# Patient Record
Sex: Male | Born: 1970 | Marital: Married | Smoking: Never smoker
Health system: Southern US, Community
[De-identification: ages and names within clinical notes are randomized; demographics above are authoritative.]

## PROBLEM LIST (undated history)

## (undated) DIAGNOSIS — E785 Hyperlipidemia, unspecified: Secondary | ICD-10-CM

## (undated) HISTORY — DX: Hyperlipidemia, unspecified: E78.5

## (undated) HISTORY — PX: APPENDECTOMY: SHX54

---

## 2005-12-30 ENCOUNTER — Inpatient Hospital Stay (HOSPITAL_COMMUNITY): Admission: EM | Admit: 2005-12-30 | Discharge: 2006-01-01 | Payer: Self-pay | Admitting: Emergency Medicine

## 2006-04-21 ENCOUNTER — Ambulatory Visit: Payer: Self-pay | Admitting: Nurse Practitioner

## 2006-12-29 DIAGNOSIS — M79609 Pain in unspecified limb: Secondary | ICD-10-CM | POA: Insufficient documentation

## 2007-08-14 IMAGING — CT CT PELVIS W/ CM
2 of 5 series · 17 of 46 positions shown, 19 images · IV contrast (ORAL OMNI 350 & 100 ML OMNI 300)
Comparison: None.

CLINICAL DATA: Abdominal pain.  
 ABDOMEN CT WITH CONTRAST:
TECHNIQUE: Multidetector CT imaging of the abdomen was performed following the standard protocol during bolus administration of intravenous contrast.
 Contrast:  100 cc Omnipaque 300
TECHNIQUE: Multidetector CT imaging of the pelvis was performed following the standard protocol during bolus administration of intravenous contrast.

[Series 2: routine abdomen · axial · 0.70mm/px · z∈[-541,-116]mm · 14 of 96 slices shown, 16 images]
[im 6/96  soft-tissue]
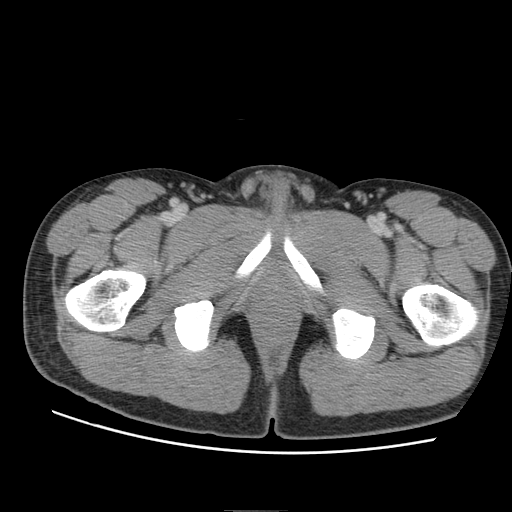
[im 6/96  bone]
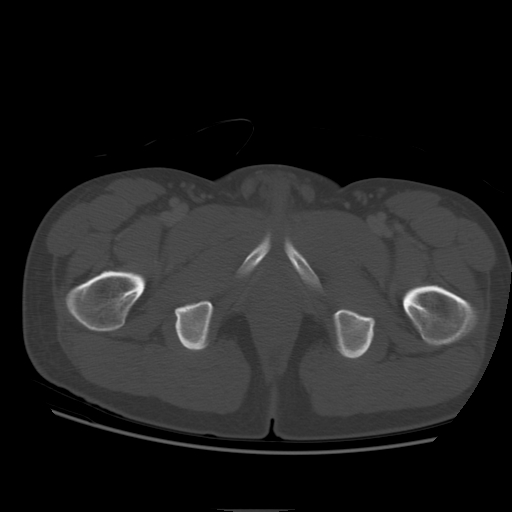
[im 11/96  soft-tissue]
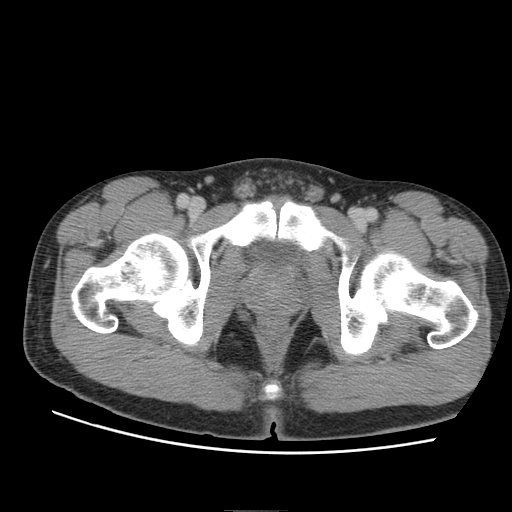
[im 21/96  soft-tissue]
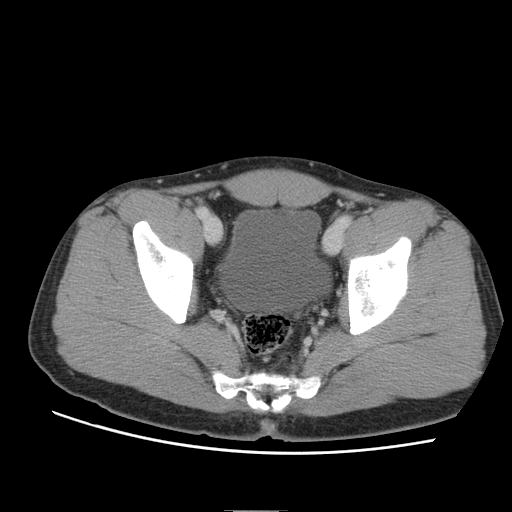
[im 26/96  soft-tissue]
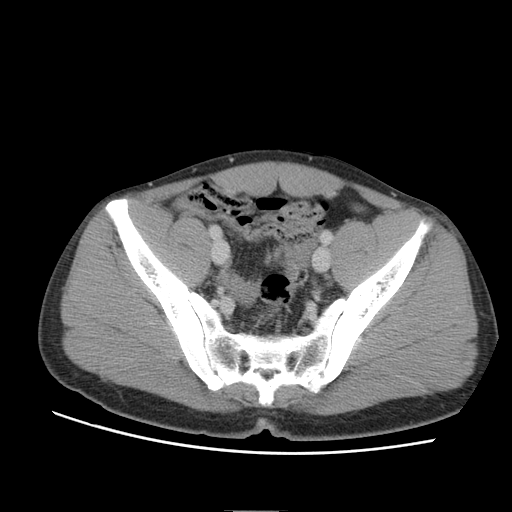
[im 31/96  soft-tissue]
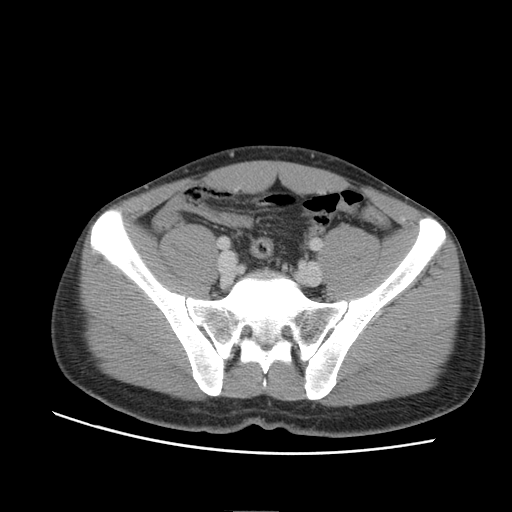
[im 41/96  soft-tissue]
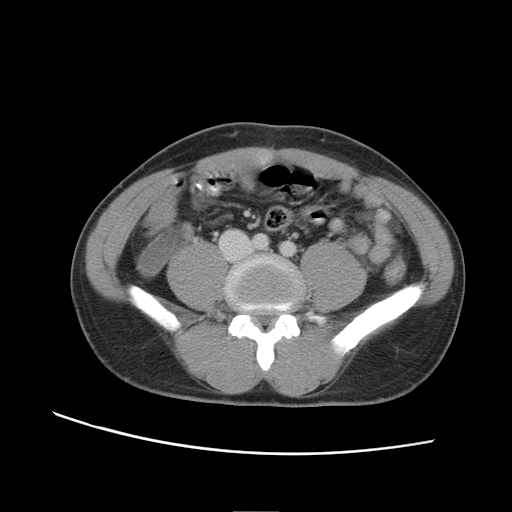
[im 46/96  soft-tissue]
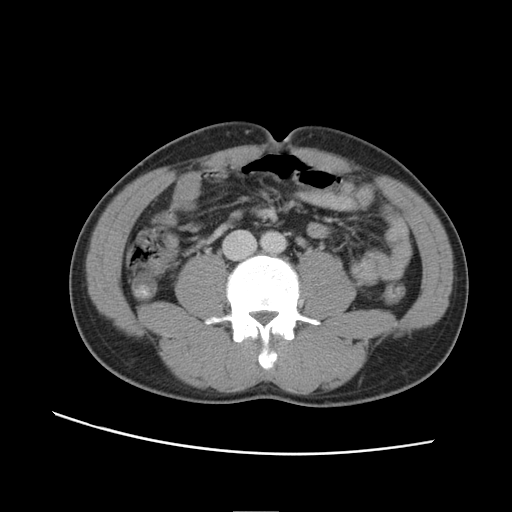
[im 51/96  soft-tissue]
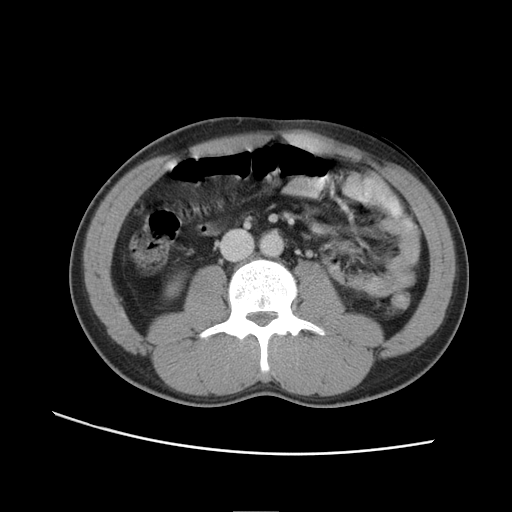
[im 56/96  soft-tissue]
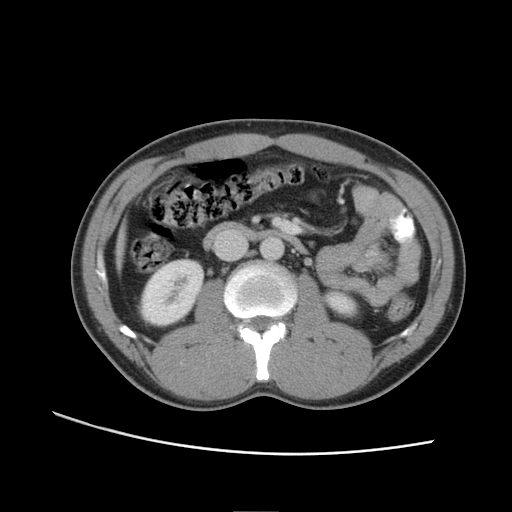
[im 56/96  bone]
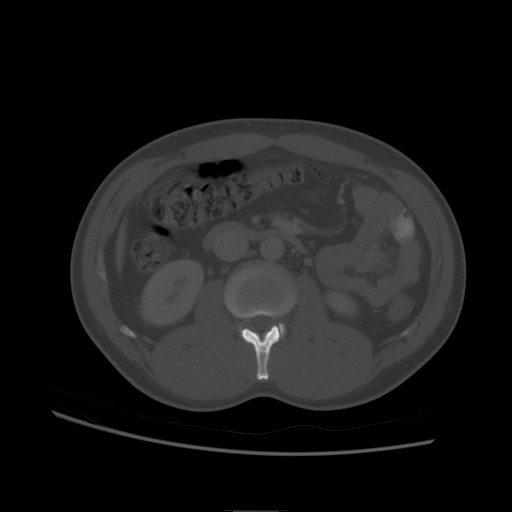
[im 66/96  soft-tissue]
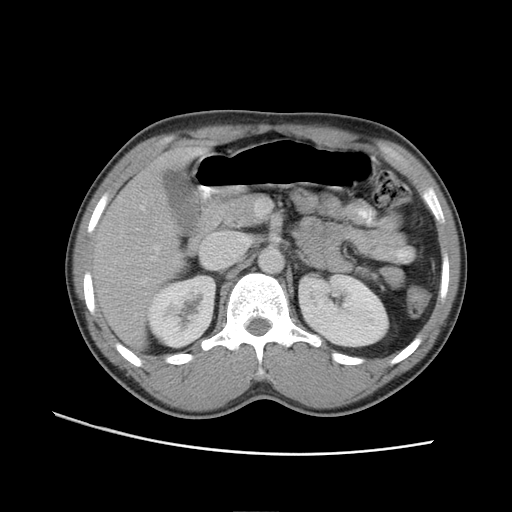
[im 71/96  soft-tissue]
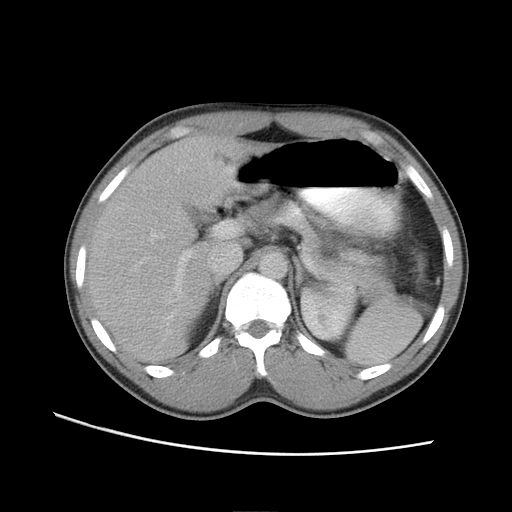
[im 76/96  soft-tissue]
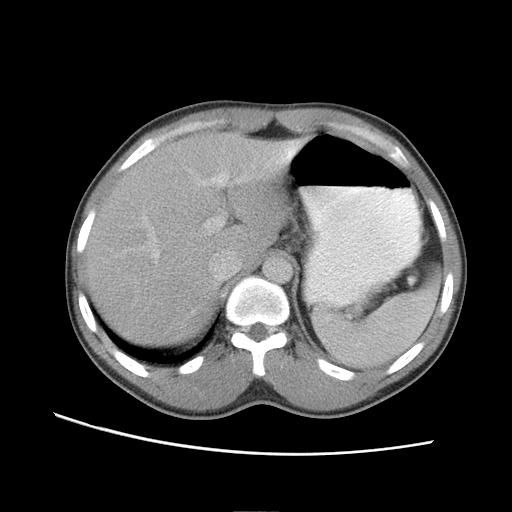
[im 86/96  soft-tissue]
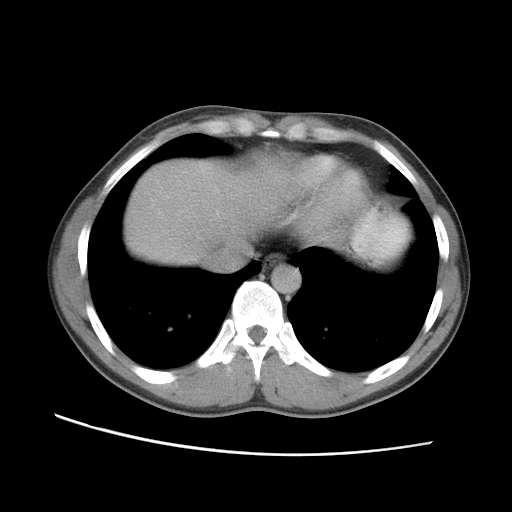
[im 91/96  soft-tissue]
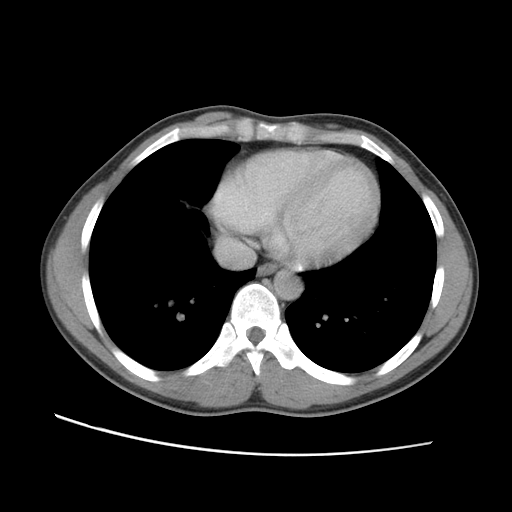

[Series 401: reformatted · coronal · 0.95mm/px · 3 of 106 slices shown]
[im 36/106  soft-tissue]
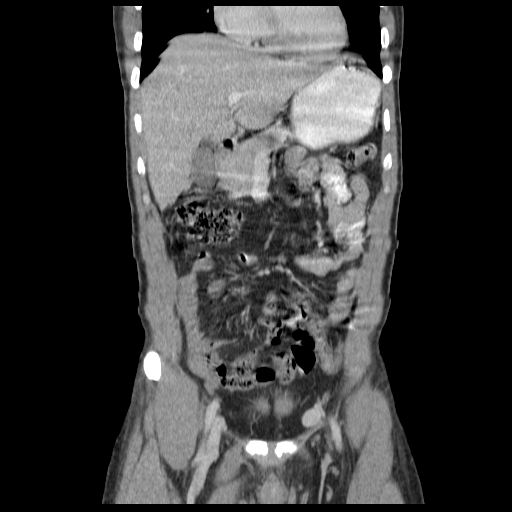
[im 47/106  soft-tissue]
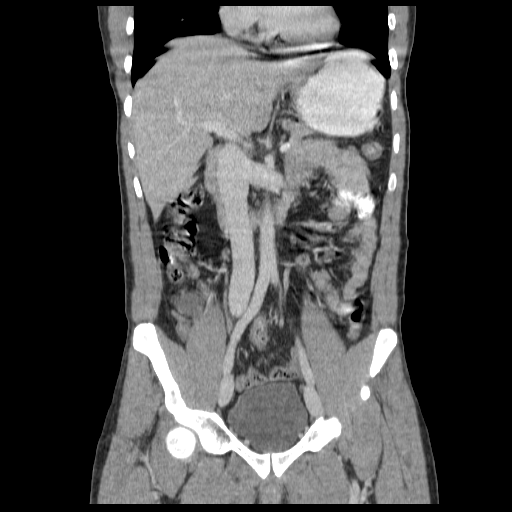
[im 59/106  soft-tissue]
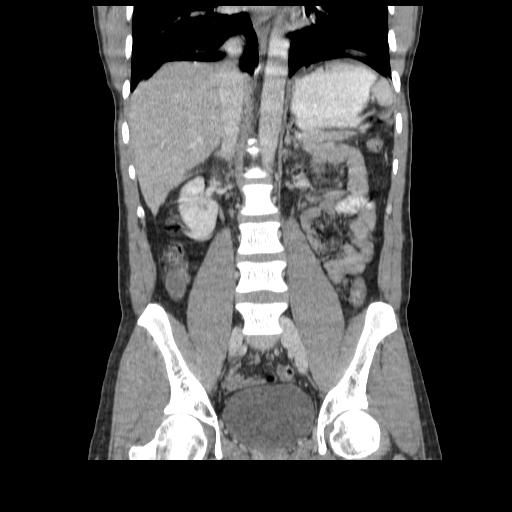

[17 of 46 positions shown; findings below may reference images not displayed]

FINDINGS: Lung bases are clear. 
 The liver is normal in attenuation and morphology.  The spleen is negative.  The pancreas is negative.  The adrenal glands are negative.  The right kidney is negative.  The left kidney is negative.  There is no adenopathy.  There is no free fluid or abscess formation.  The gallbladder is normal in appearance. 
 The appendix is abnormal in appearance measuring 16 mm in diameter.  Findings are consistent with acute appendicitis.  There is no evidence for appendiceal perforation or periappendiceal abscess formation.  
 The kidneys are negative. 
 Review of bone windows shows no lytic or sclerotic lesions.
IMPRESSION: Acute appendicitis.  
 PELVIS CT WITH CONTRAST:
FINDINGS: Urinary bladder is negative.  Negative for free fluid.  Negative for adenopathy.  
 Review of bone windows is negative.
IMPRESSION: No acute pelvis CT findings.

## 2016-06-07 ENCOUNTER — Other Ambulatory Visit: Payer: Self-pay | Admitting: General Surgery

## 2018-03-29 ENCOUNTER — Other Ambulatory Visit: Payer: Self-pay

## 2018-03-29 ENCOUNTER — Encounter (HOSPITAL_COMMUNITY): Payer: Self-pay | Admitting: Emergency Medicine

## 2018-03-29 ENCOUNTER — Emergency Department (HOSPITAL_COMMUNITY)
Admission: EM | Admit: 2018-03-29 | Discharge: 2018-03-29 | Disposition: A | Discharge status: Home / Self Care | Payer: Medicaid Other | Attending: Emergency Medicine | Admitting: Emergency Medicine

## 2018-03-29 DIAGNOSIS — R0981 Nasal congestion: Secondary | ICD-10-CM | POA: Diagnosis present

## 2018-03-29 DIAGNOSIS — J069 Acute upper respiratory infection, unspecified: Secondary | ICD-10-CM | POA: Diagnosis not present

## 2018-03-29 DIAGNOSIS — Z209 Contact with and (suspected) exposure to unspecified communicable disease: Secondary | ICD-10-CM | POA: Insufficient documentation

## 2018-03-29 MED ORDER — FLUTICASONE PROPIONATE 50 MCG/ACT NA SUSP: 1.0000 | Freq: Every day | NASAL | 2 refills | Status: DC

## 2018-03-29 NOTE — ED Triage Notes (Signed)
Pt c/o sinus pain in upper nose and runny nose for 2 days.

## 2018-03-29 NOTE — Discharge Instructions (Signed)
You can take Tylenol or Ibuprofen as directed for pain. You can alternate Tylenol and Ibuprofen every 4 hours. If you take Tylenol at 1pm, then you can take Ibuprofen at 5pm. Then you can take Tylenol again at 9pm.   Use Flonase as directed.  [facility] to establish a primary care doctor if you do not have one.   Return emergency department for any high fever, difficulty breathing, chest pain, cough or any other worsening or concerning symptoms.

## 2018-03-29 NOTE — ED Provider Notes (Signed)
[facility]-EMERGENCY DEPT Provider Note   CSN: [id number] Arrival date & time: 03/29/18  [id number]     History   Chief Complaint Chief Complaint  Patient presents with  . Facial Pain  . Nasal Congestion    HPI William Mcconnell is a 47 y.o. male who presents for evaluation of 2 days of rhinorrhea, congestion.  He also reports some pain and pressure to his nose.  Patient states he has not had any cough, sore throat.  He states he has had some subjective fever chills but has not actually measured a temperature.  He reports both his children are at home sick with similar symptoms.  Patient denies any difficulty breathing, vomiting.  The history is provided by the patient.    History reviewed. No pertinent past medical history.  Patient Active Problem List   Diagnosis Date Noted  . FINGER PAIN 12/29/2006    Past Surgical History:  Procedure Laterality Date  . APPENDECTOMY          Home Medications    Prior to Admission medications   Medication Sig Start Date End Date Taking? Authorizing Provider  fluticasone (FLONASE) 50 MCG/ACT nasal spray Place 1 spray into both nostrils daily. 03/29/18   Maxwell CaulLayden, Emoree Sasaki A, PA-C    Family History No family history on file.  Social History Social History   Tobacco Use  . Smoking status: Never Smoker  . Smokeless tobacco: Never Used  Substance Use Topics  . Alcohol use: Not Currently  . Drug use: Not on file     Allergies   Patient has no known allergies.   Review of Systems Review of Systems  Constitutional: Positive for chills and fever (subjective).  HENT: Positive for congestion, rhinorrhea and sinus pain. Negative for sore throat.   Respiratory: Negative for shortness of breath.   Gastrointestinal: Negative for vomiting.  All other systems reviewed and are negative.    Physical Exam Updated Vital Signs BP 118/90 (BP Location: Right Arm)   Pulse 70   Temp 98.1 F (36.7 C) (Oral)   Resp 18    SpO2 100%   Physical Exam Vitals signs and nursing note reviewed.  Constitutional:      Appearance: He is well-developed.  HENT:     Head: Normocephalic and atraumatic.     Nose: Nasal tenderness present.     Right Turbinates: Enlarged and swollen.     Left Turbinates: Enlarged and swollen.     Comments: Tenderness and erythematous nasal turbinates bilaterally.  Mild tenderness noted to the nasal bridge.  No overlying soft tissue swelling, erythema, edema.    Mouth/Throat:     Mouth: Mucous membranes are moist.     Pharynx: Oropharynx is clear.     Comments: Steer oropharynx is clear without any signs of erythema, edema, exudates. Eyes:     General: No scleral icterus.       Right eye: No discharge.        Left eye: No discharge.     Conjunctiva/sclera: Conjunctivae normal.  Pulmonary:     Effort: Pulmonary effort is normal.  Skin:    General: Skin is warm and dry.  Neurological:     Mental Status: He is alert.  Psychiatric:        Speech: Speech normal.        Behavior: Behavior normal.      ED Treatments / Results  Labs (all labs ordered are listed, but only abnormal results are displayed) Labs Reviewed -  No data to display  EKG None  Radiology No results found.  Procedures Procedures (including critical care time)  Medications Ordered in ED Medications - No data to display   Initial Impression / Assessment and Plan / ED Course  I have reviewed the triage vital signs and the nursing notes.  Pertinent labs & imaging results that were available during my care of the patient were reviewed by me and considered in my medical decision making (see chart for details).     47 year old male who presents for evaluation of 2 days of sinus congestion, rhinorrhea.  Reports sick contacts at home. Patient is afebrile, non-toxic appearing, sitting comfortably on examination table. Vital signs reviewed and stable.  Consider viral URI.  Will plan for supportive care  measures.  Patient had ample opportunity for questions and discussion. All patient's questions were answered with full understanding. Strict return precautions discussed. Patient expresses understanding and agreement to plan.   Portions of this note were generated with Scientist, clinical (histocompatibility and immunogenetics)Dragon dictation software. Dictation errors may occur despite best attempts at proofreading.   Final Clinical Impressions(s) / ED Diagnoses   Final diagnoses:  Viral upper respiratory tract infection    ED Discharge Orders         Ordered    fluticasone (FLONASE) 50 MCG/ACT nasal spray  Daily     03/29/18 1451           Maxwell CaulLayden, Manus Weedman A, PA-C 03/29/18 1457    Alvira MondaySchlossman, Erin, MD 03/30/18 [phone]

## 2018-04-13 ENCOUNTER — Ambulatory Visit: Payer: Medicaid Other | Admitting: Family Medicine

## 2019-06-17 ENCOUNTER — Ambulatory Visit: Payer: Medicaid Other | Attending: Internal Medicine

## 2019-12-01 ENCOUNTER — Other Ambulatory Visit: Payer: Self-pay

## 2019-12-01 ENCOUNTER — Encounter (HOSPITAL_COMMUNITY): Payer: Self-pay

## 2019-12-01 ENCOUNTER — Emergency Department (HOSPITAL_COMMUNITY)
Admission: EM | Admit: 2019-12-01 | Discharge: 2019-12-01 | Disposition: A | Discharge status: Home / Self Care | Payer: Medicaid Other | Attending: Emergency Medicine | Admitting: Emergency Medicine

## 2019-12-01 DIAGNOSIS — R062 Wheezing: Secondary | ICD-10-CM | POA: Diagnosis not present

## 2019-12-01 DIAGNOSIS — B001 Herpesviral vesicular dermatitis: Secondary | ICD-10-CM

## 2019-12-01 DIAGNOSIS — Z20822 Contact with and (suspected) exposure to covid-19: Secondary | ICD-10-CM | POA: Insufficient documentation

## 2019-12-01 DIAGNOSIS — R059 Cough, unspecified: Secondary | ICD-10-CM

## 2019-12-01 DIAGNOSIS — J00 Acute nasopharyngitis [common cold]: Secondary | ICD-10-CM | POA: Insufficient documentation

## 2019-12-01 DIAGNOSIS — R05 Cough: Secondary | ICD-10-CM | POA: Diagnosis present

## 2019-12-01 LAB — CBC
HCT: 42.6 % (ref 39.0–52.0)
Hemoglobin: 14.1 g/dL (ref 13.0–17.0)
MCH: 28.3 pg (ref 26.0–34.0)
MCHC: 33.1 g/dL (ref 30.0–36.0)
MCV: 85.4 fL (ref 80.0–100.0)
Platelets: 270 10*3/uL (ref 150–400)
RBC: 4.99 MIL/uL (ref 4.22–5.81)
RDW: 13.9 % (ref 11.5–15.5)
WBC: 4.9 10*3/uL (ref 4.0–10.5)
nRBC: 0 % (ref 0.0–0.2)

## 2019-12-01 LAB — BASIC METABOLIC PANEL
Anion gap: 11 (ref 5–15)
BUN: 14 mg/dL (ref 6–20)
CO2: 25 mmol/L (ref 22–32)
Calcium: 9.3 mg/dL (ref 8.9–10.3)
Chloride: 105 mmol/L (ref 98–111)
Creatinine, Ser: 1.06 mg/dL (ref 0.61–1.24)
GFR calc Af Amer: >60 mL/min (ref >60)
GFR calc non Af Amer: >60 mL/min (ref >60)
Glucose, Bld: 110 mg/dL — ABNORMAL HIGH (ref 70–99)
Potassium: 4.4 mmol/L (ref 3.5–5.1)
Sodium: 141 mmol/L (ref 135–145)

## 2019-12-01 LAB — D-DIMER, QUANTITATIVE: D-Dimer, Quant: 0.38 ug/mL-FEU (ref 0.00–0.50)

## 2019-12-01 MED ORDER — ALBUTEROL SULFATE HFA 108 (90 BASE) MCG/ACT IN AERS
2.0000 | INHALATION_SPRAY | Freq: Once | RESPIRATORY_TRACT | Status: AC
  Administered 2019-12-01: 2 via RESPIRATORY_TRACT
  Filled 2019-12-01: qty 6.7

## 2019-12-01 NOTE — ED Provider Notes (Signed)
[facility]-EMERGENCY DEPT Provider Note   CSN: [id number] Arrival date & time: 12/01/19  2124     History Chief Complaint  Patient presents with  . Cough    William Mcconnell is a 49 y.o. male presenting for evaluation of cough and shortness of breath.  Patient states approximately 3 hours prior to arrival he had acute onset cough and shortness of breath.  He states he was working very hard to breathe.  He has increased pain of his right side chest when taking a deep breath in.  He has never had symptoms like this before.  He denies associated fevers, chills, nausea, vomiting abdominal pain, urinary symptoms, normal bowel movements.  He denies leg pain or swelling.  Within the past month, he has flown back from [place], which is approximately 17-hour flight.  He denies recent surgeries, mobilization, trauma, history of cancer, history previous DVT/PE, or hormone use.  He denies history of asthma or COPD.  He does not use an inhaler regularly.  He denies sick contacts.  He has received the Covid vaccine.  HPI     History reviewed. No pertinent past medical history.  Patient Active Problem List   Diagnosis Date Noted  . FINGER PAIN 12/29/2006    Past Surgical History:  Procedure Laterality Date  . APPENDECTOMY         History reviewed. No pertinent family history.  Social History   Tobacco Use  . Smoking status: Never Smoker  . Smokeless tobacco: Never Used  Substance Use Topics  . Alcohol use: Not Currently  . Drug use: Not on file    Home Medications Prior to Admission medications   Medication Sig Start Date End Date Taking? Authorizing Provider  fluticasone (FLONASE) 50 MCG/ACT nasal spray Place 1 spray into both nostrils daily. 03/29/18   Maxwell Caul, PA-C    Allergies    Patient has no known allergies.  Review of Systems   Review of Systems  Respiratory: Positive for cough and shortness of breath.   All other systems reviewed and are  negative.   Physical Exam Updated Vital Signs BP 111/85 (BP Location: Left Arm)   Pulse 77   Temp 98.5 F (36.9 C) (Oral)   Resp 20   SpO2 98%   Physical Exam Vitals and nursing note reviewed.  Constitutional:      General: He is not in acute distress.    Appearance: He is well-developed.     Comments: Sitting in a chair no acute distress  HENT:     Head: Normocephalic and atraumatic.  Eyes:     Conjunctiva/sclera: Conjunctivae normal.     Pupils: Pupils are equal, round, and reactive to light.  Cardiovascular:     Rate and Rhythm: Normal rate and regular rhythm.     Pulses: Normal pulses.  Pulmonary:     Effort: Pulmonary effort is normal. No respiratory distress.     Breath sounds: Wheezing present.     Comments: Expiratory wheezing of the right lung.  No wheezing on the left.  Speaking in full sentences.  Sats stable on room air. Abdominal:     General: There is no distension.     Palpations: Abdomen is soft. There is no mass.     Tenderness: There is no abdominal tenderness. There is no guarding or rebound.  Musculoskeletal:        General: Normal range of motion.     Cervical back: Normal range of motion and neck  supple.     Right lower leg: No edema.     Left lower leg: No edema.     Comments: No leg pain or swelling.  Skin:    General: Skin is warm and dry.     Capillary Refill: Capillary refill takes less than 2 seconds.  Neurological:     Mental Status: He is alert and oriented to person, place, and time.     ED Results / Procedures / Treatments   Labs (all labs ordered are listed, but only abnormal results are displayed) Labs Reviewed  BASIC METABOLIC PANEL - Abnormal; Notable for the following components:      Result Value   Glucose, Bld 110 (*)    All other components within normal limits  SARS CORONAVIRUS 2 BY RT PCR (HOSPITAL ORDER, PERFORMED IN [facility])  CBC  D-DIMER, QUANTITATIVE (NOT AT [facility])    EKG None  Radiology No  results found.  Procedures Procedures (including critical care time)  Medications Ordered in ED Medications  albuterol (VENTOLIN HFA) 108 (90 Base) MCG/ACT inhaler 2 puff (2 puffs Inhalation Given 12/01/19 2248)    ED Course  I have reviewed the triage vital signs and the nursing notes.  Pertinent labs & imaging results that were available during my care of the patient were reviewed by me and considered in my medical decision making (see chart for details).    MDM Rules/Calculators/A&P                          Patient presenting for evaluation of cough and shortness of breath.  On exam, patient does have wheezing of the right lobe.  No history of asthma.  He did recently have a long flight.  As such, consider PE with acute onset symptoms as well as inspiratory pain.  Also consider viral illness or reactive airway disease.  Will treat wheezing with albuterol.  Will obtain labs including dimer.  With stable vital signs and patient being PERC negative, if dimer is negative I do not believe he needs a CTA.  On reassessment after albuterol, patient reports complete resolution of his symptoms.  Wheezing has resolved.  Labs interpreted by me, overall reassuring.  Dimer negative.  Discussed symptomatic treatment with patient.  Offered Covid test, patient declines.  Additionally, patient requesting I evaluate his lower lip.  On exam, patient has a cold sore of his lower lip.  I discussed symptomatic treatment for this, as well as likely etiology.  At this time, patient appears safe for discharge.  Return precautions given.  Patient states he understands and agrees to plan.  Final Clinical Impression(s) / ED Diagnoses Final diagnoses:  Wheezing  Cough  Cold sore    Rx / DC Orders ED Discharge Orders    None       Alveria Apley, PA-C 12/01/19 2345    Benjiman Core, MD 12/06/19 [phone]

## 2019-12-01 NOTE — Discharge Instructions (Addendum)
Your blood work was reassuring.  It shows that you do not have signs of a blood clot. Use the albuterol inhaler as needed for wheezing or shortness of breath. Use over-the-counter cough drops/syrup as needed for cough. Your Covid test is pending.  If positive, you receive a phone call.  If negative, you will not.  Either way, you may check online on MyChart. Use over-the-counter numbing spray as needed for pain control for your cold sore on your lip. Return to the emergency room if you develop increased difficulty breathing, severe worsening chest pain, or any new or worsening, or concerning symptoms.

## 2019-12-01 NOTE — ED Triage Notes (Signed)
Pt states that he's been having a dry cough for two hours and it's making him short of breath Pt denies any vomiting or diarrhea and no fevers

## 2020-07-24 DIAGNOSIS — J301 Allergic rhinitis due to pollen: Secondary | ICD-10-CM | POA: Diagnosis not present

## 2020-07-24 DIAGNOSIS — Z0000 Encounter for general adult medical examination without abnormal findings: Secondary | ICD-10-CM | POA: Diagnosis not present

## 2020-07-24 DIAGNOSIS — E782 Mixed hyperlipidemia: Secondary | ICD-10-CM | POA: Diagnosis not present

## 2020-07-24 DIAGNOSIS — Z1211 Encounter for screening for malignant neoplasm of colon: Secondary | ICD-10-CM | POA: Diagnosis not present

## 2020-07-24 DIAGNOSIS — Z13228 Encounter for screening for other metabolic disorders: Secondary | ICD-10-CM | POA: Diagnosis not present

## 2020-07-24 DIAGNOSIS — Z1212 Encounter for screening for malignant neoplasm of rectum: Secondary | ICD-10-CM | POA: Diagnosis not present

## 2020-07-24 DIAGNOSIS — Z13220 Encounter for screening for lipoid disorders: Secondary | ICD-10-CM | POA: Diagnosis not present

## 2020-07-24 DIAGNOSIS — Z125 Encounter for screening for malignant neoplasm of prostate: Secondary | ICD-10-CM | POA: Diagnosis not present

## 2020-07-24 DIAGNOSIS — H6123 Impacted cerumen, bilateral: Secondary | ICD-10-CM | POA: Diagnosis not present

## 2020-07-24 DIAGNOSIS — Z130 Encounter for screening for diseases of the blood and blood-forming organs and certain disorders involving the immune mechanism: Secondary | ICD-10-CM | POA: Diagnosis not present

## 2020-09-01 DIAGNOSIS — E7849 Other hyperlipidemia: Secondary | ICD-10-CM | POA: Diagnosis not present

## 2020-09-01 DIAGNOSIS — R0602 Shortness of breath: Secondary | ICD-10-CM | POA: Diagnosis not present

## 2020-09-13 ENCOUNTER — Other Ambulatory Visit: Payer: Self-pay

## 2020-09-13 ENCOUNTER — Emergency Department (HOSPITAL_COMMUNITY)
Admission: EM | Admit: 2020-09-13 | Discharge: 2020-09-14 | Disposition: A | Discharge status: Home / Self Care | Payer: Medicaid Other | Attending: Emergency Medicine | Admitting: Emergency Medicine

## 2020-09-13 ENCOUNTER — Emergency Department (HOSPITAL_COMMUNITY): Payer: Medicaid Other

## 2020-09-13 DIAGNOSIS — Z7982 Long term (current) use of aspirin: Secondary | ICD-10-CM | POA: Insufficient documentation

## 2020-09-13 DIAGNOSIS — R0602 Shortness of breath: Secondary | ICD-10-CM | POA: Diagnosis not present

## 2020-09-13 DIAGNOSIS — R0789 Other chest pain: Secondary | ICD-10-CM | POA: Diagnosis not present

## 2020-09-13 DIAGNOSIS — R079 Chest pain, unspecified: Secondary | ICD-10-CM | POA: Insufficient documentation

## 2020-09-13 LAB — CBC
HCT: 42.2 % (ref 39.0–52.0)
Hemoglobin: 13.9 g/dL (ref 13.0–17.0)
MCH: 27.8 pg (ref 26.0–34.0)
MCHC: 32.9 g/dL (ref 30.0–36.0)
MCV: 84.4 fL (ref 80.0–100.0)
Platelets: 245 10*3/uL (ref 150–400)
RBC: 5 MIL/uL (ref 4.22–5.81)
RDW: 13.7 % (ref 11.5–15.5)
WBC: 3.6 10*3/uL — ABNORMAL LOW (ref 4.0–10.5)
nRBC: 0 % (ref 0.0–0.2)

## 2020-09-13 NOTE — ED Triage Notes (Signed)
Pt c/o midline CP starting yesterday, states it lasted for 1 hr and improved after taking ASA. States another episode occurred tonight started at 10pm and has stayed constant since.

## 2020-09-13 NOTE — ED Provider Notes (Signed)
Emergency Medicine Provider Triage Evaluation Note  William Mcconnell , a 50 y.o. male  was evaluated in triage.  Pt complains of CP. Sx started yesterday while sitting. Non radiating. Central chest pressure that he describes as squeezing. Lasted about 1 hour. Associated SOB. No diaphoresis. Took 81 mg ASA with relief. Occurred again today and lasted about 30 minutes so he came to the ED. Not a smoker. No FMHx of cardiac issues.   Physical Exam  BP 120/68 (BP Location: Right Arm)   Pulse 81   Temp 98.8 F (37.1 C) (Oral)   Resp 18   Ht 5\' 10"  (1.778 m)   Wt 81.2 kg   SpO2 97%   BMI 25.68 kg/m  Gen:   Awake, no distress   Resp:  Normal effort  MSK:   Moves extremities without difficulty  Other:    Medical Decision Making  Medically screening exam initiated at 10:48 PM.  Appropriate orders placed.  William Mcconnell was informed that the remainder of the evaluation will be completed by another provider, this initial triage assessment does not replace that evaluation, and the importance of remaining in the ED until their evaluation is complete.     William Erp, PA-C 09/13/20 2249    09/15/20, MD 09/14/20 [phone]

## 2020-09-14 LAB — BASIC METABOLIC PANEL
Anion gap: 4 — ABNORMAL LOW (ref 5–15)
BUN: 15 mg/dL (ref 6–20)
CO2: 28 mmol/L (ref 22–32)
Calcium: 9.7 mg/dL (ref 8.9–10.3)
Chloride: 108 mmol/L (ref 98–111)
Creatinine, Ser: 1.07 mg/dL (ref 0.61–1.24)
GFR, Estimated: >60 mL/min (ref >60)
Glucose, Bld: 114 mg/dL — ABNORMAL HIGH (ref 70–99)
Potassium: 4 mmol/L (ref 3.5–5.1)
Sodium: 140 mmol/L (ref 135–145)

## 2020-09-14 LAB — LIPASE, BLOOD: Lipase: 30 U/L (ref 11–51)

## 2020-09-14 LAB — TROPONIN I (HIGH SENSITIVITY)
Troponin I (High Sensitivity): 3 ng/L (ref <18)
Troponin I (High Sensitivity): 2 ng/L (ref <18)

## 2020-09-14 MED ORDER — PANTOPRAZOLE SODIUM 40 MG PO TBEC
40.0000 mg | DELAYED_RELEASE_TABLET | Freq: Every day | ORAL | Status: DC
  Administered 2020-09-14: 40 mg via ORAL
  Filled 2020-09-14: qty 1

## 2020-09-14 MED ORDER — ALUM & MAG HYDROXIDE-SIMETH 200-200-20 MG/5ML PO SUSP
30.0000 mL | Freq: Once | ORAL | Status: AC
  Administered 2020-09-14: 30 mL via ORAL
  Filled 2020-09-14: qty 30

## 2020-09-14 MED ORDER — LIDOCAINE VISCOUS HCL 2 % MT SOLN
15.0000 mL | Freq: Once | OROMUCOSAL | Status: AC
  Administered 2020-09-14: 15 mL via ORAL
  Filled 2020-09-14: qty 15

## 2020-09-14 NOTE — Discharge Instructions (Addendum)
Recommend taking Prilosec 20 mg once daily. Stop use of ibuprofen and take Tylenol as needed instead. Follow up with your doctor in one week for recheck.   Return to the ED with any new or concerning symptoms at any time.

## 2020-09-14 NOTE — ED Provider Notes (Signed)
[facility]-EMERGENCY DEPT Provider Note   CSN: [id number] Arrival date & time: 09/13/20  2222     History Chief Complaint  Patient presents with   Chest Pain    William Mcconnell is a 50 y.o. male.  Patient with a history of high cholesterol presents for evaluation of chest pain that started yesterday evening but resolved after taking an aspirin. Tonight, the pain started again around 10:00 pm, in the same location in the lower center of his chest. It does not radiate. It remains for about 30 minutes, resolves and returns within the hour. No history of heart disease. He reports SOB but no nausea, vomiting. He denies cough, fever, congestion. No increased belching or abdominal pain. He does state he has had similar symptoms in the past when trying new foods.   The history is provided by the patient. No language interpreter was used.  Chest Pain Associated symptoms: shortness of breath   Associated symptoms: no abdominal pain, no back pain, no cough, no diaphoresis, no fever, no nausea, no palpitations, no vomiting and no weakness       No past medical history on file.  Patient Active Problem List   Diagnosis Date Noted   FINGER PAIN 12/29/2006    Past Surgical History:  Procedure Laterality Date   APPENDECTOMY         No family history on file.  Social History   Tobacco Use   Smoking status: Never   Smokeless tobacco: Never  Substance Use Topics   Alcohol use: Not Currently    Home Medications Prior to Admission medications   Medication Sig Start Date End Date Taking? Authorizing Provider  albuterol (VENTOLIN HFA) 108 (90 Base) MCG/ACT inhaler Inhale 2 puffs into the lungs every 6 (six) hours as needed. 09/05/20  Yes [provider]  aspirin 81 MG chewable tablet Chew 81 mg by mouth once.   Yes [provider]  cetirizine (ZYRTEC) 10 MG tablet Take 10 mg by mouth daily. 07/24/20  Yes [provider]  FLAXSEED, LINSEED, PO  Take by mouth. Boiled Flaxseeds and Drunk mixed w/ coffee   Yes [provider]  fluticasone (FLONASE) 50 MCG/ACT nasal spray Place 1 spray into both nostrils daily. 03/29/18  Yes Graciella Freer A, PA-C  ibuprofen (ADVIL) 200 MG tablet Take 200 mg by mouth every 6 (six) hours as needed.   Yes [provider]  rosuvastatin (CRESTOR) 5 MG tablet Take 5 mg by mouth at bedtime. 07/25/20  Yes [provider]    Allergies    Patient has no known allergies.  Review of Systems   Review of Systems  Constitutional:  Negative for chills, diaphoresis and fever.  HENT: Negative.    Respiratory:  Positive for shortness of breath. Negative for cough.   Cardiovascular:  Positive for chest pain. Negative for palpitations.  Gastrointestinal: Negative.  Negative for abdominal pain, nausea and vomiting.  Musculoskeletal: Negative.  Negative for back pain.  Skin: Negative.   Neurological: Negative.  Negative for weakness.   Physical Exam Updated Vital Signs BP 125/85   Pulse (!) 57   Temp 98.8 F (37.1 C) (Oral)   Resp 13   Ht 5\' 10"  (1.778 m)   Wt 81.2 kg   SpO2 98%   BMI 25.68 kg/m   Physical Exam Vitals and nursing note reviewed.  Constitutional:      General: He is not in acute distress.    Appearance: He is well-developed. He is  not ill-appearing.  HENT:     Head: Normocephalic.  Cardiovascular:     Rate and Rhythm: Normal rate and regular rhythm.     Heart sounds: No murmur heard. Pulmonary:     Effort: Pulmonary effort is normal.     Breath sounds: Normal breath sounds. No wheezing, rhonchi or rales.  Abdominal:     General: Bowel sounds are normal.     Palpations: Abdomen is soft.     Tenderness: There is no abdominal tenderness. There is no guarding or rebound.  Musculoskeletal:        General: Normal range of motion.     Cervical back: Normal range of motion and neck supple.     Right lower leg: No edema.     Left lower leg: No edema.  Skin:     General: Skin is warm and dry.  Neurological:     General: No focal deficit present.     Mental Status: He is alert and oriented to person, place, and time.    ED Results / Procedures / Treatments   Labs (all labs ordered are listed, but only abnormal results are displayed) Labs Reviewed  BASIC METABOLIC PANEL - Abnormal; Notable for the following components:      Result Value   Glucose, Bld 114 (*)    Anion gap 4 (*)    All other components within normal limits  CBC - Abnormal; Notable for the following components:   WBC 3.6 (*)    All other components within normal limits  LIPASE, BLOOD  TROPONIN I (HIGH SENSITIVITY)  TROPONIN I (HIGH SENSITIVITY)   Results for orders placed or performed during the hospital encounter of 09/13/20  Lipase, blood  Result Value Ref Range   Lipase 30 11 - 51 U/L  Basic metabolic panel  Result Value Ref Range   Sodium 140 135 - 145 mmol/L   Potassium 4.0 3.5 - 5.1 mmol/L   Chloride 108 98 - 111 mmol/L   CO2 28 22 - 32 mmol/L   Glucose, Bld 114 (H) 70 - 99 mg/dL   BUN 15 6 - 20 mg/dL   Creatinine, Ser 1.61 0.61 - 1.24 mg/dL   Calcium 9.7 8.9 - 09.6 mg/dL   GFR, Estimated >04 >54 mL/min   Anion gap 4 (L) 5 - 15  CBC  Result Value Ref Range   WBC 3.6 (L) 4.0 - 10.5 K/uL   RBC 5.00 4.22 - 5.81 MIL/uL   Hemoglobin 13.9 13.0 - 17.0 g/dL   HCT 09.8 11.9 - 14.7 %   MCV 84.4 80.0 - 100.0 fL   MCH 27.8 26.0 - 34.0 pg   MCHC 32.9 30.0 - 36.0 g/dL   RDW 82.9 56.2 - 13.0 %   Platelets 245 150 - 400 K/uL   nRBC 0.0 0.0 - 0.2 %  Troponin I (High Sensitivity)  Result Value Ref Range   Troponin I (High Sensitivity) 3 <18 ng/L    EKG EKG Interpretation  Date/Time:  Wednesday September 13 2020 22:38:54 EDT Ventricular Rate:  86 PR Interval:  160 QRS Duration: 91 QT Interval:  343 QTC Calculation: 411 R Axis:   80 Text Interpretation: Sinus rhythm Borderline T wave abnormalities inferior leads Confirmed by Geoffery Lyons ([id number]) on 09/14/2020  12:07:55 AM  Radiology DG Chest 2 View  Result Date: 09/13/2020 CLINICAL DATA:  Chest pain EXAM: CHEST - 2 VIEW COMPARISON:  None. FINDINGS: The heart size and mediastinal contours are within normal limits. Both lungs  are clear. The visualized skeletal structures are unremarkable. IMPRESSION: Negative. Electronically Signed   By: Charlett Nose M.D.   On: 09/13/2020 23:19    Procedures Procedures   Medications Ordered in ED Medications  alum & mag hydroxide-simeth (MAALOX/MYLANTA) 200-200-20 MG/5ML suspension 30 mL (30 mLs Oral Given 09/14/20 0143)    And  lidocaine (XYLOCAINE) 2 % viscous mouth solution 15 mL (15 mLs Oral Given 09/14/20 0143)    ED Course  I have reviewed the triage vital signs and the nursing notes.  Pertinent labs & imaging results that were available during my care of the patient were reviewed by me and considered in my medical decision making (see chart for details).    MDM Rules/Calculators/A&P                          Patient to ED  for evaluation of chest pain as detailed in the HPI. He reports his pain is a 3/10 now, better than earlier.   He is very well appearing, in NAD. VSS. Initial troponin is negative. EKG with nonspecific changes only. He reports starting a new cholesterol medication one month ago but that his cholesterol levels are still "high". No FH heart disease. Heart Score of 3. Delta troponin pending.   With history of similar symptoms related to diet, will provide GI cocktail to see if this resolves discomfort.   Delta troponin is negative. Doubt ACS or other cardiac chest pain.   GI cocktail with little relief. Pain is no worse. Still feel symptoms likely related to GI source. Will start Prilosec and recommend one week follow up with PCP for recheck.   Final Clinical Impression(s) / ED Diagnoses Final diagnoses:  None   Nonspecific chest pain   Rx / DC Orders ED Discharge Orders     None        Elpidio Anis, PA-C 09/14/20  0256    Geoffery Lyons, MD 09/14/20 [phone]

## 2020-09-18 DIAGNOSIS — E875 Hyperkalemia: Secondary | ICD-10-CM | POA: Diagnosis not present

## 2020-10-15 DIAGNOSIS — Z20822 Contact with and (suspected) exposure to covid-19: Secondary | ICD-10-CM | POA: Diagnosis not present

## 2020-11-02 DIAGNOSIS — A493 Mycoplasma infection, unspecified site: Secondary | ICD-10-CM | POA: Diagnosis not present

## 2020-11-26 ENCOUNTER — Ambulatory Visit (HOSPITAL_COMMUNITY)
Admission: EM | Admit: 2020-11-26 | Discharge: 2020-11-26 | Disposition: A | Discharge status: Home / Self Care | Payer: Medicaid Other

## 2020-11-26 DIAGNOSIS — Z20822 Contact with and (suspected) exposure to covid-19: Secondary | ICD-10-CM | POA: Diagnosis not present

## 2021-01-09 DIAGNOSIS — H5213 Myopia, bilateral: Secondary | ICD-10-CM | POA: Diagnosis not present

## 2021-03-06 DIAGNOSIS — H524 Presbyopia: Secondary | ICD-10-CM | POA: Diagnosis not present

## 2022-04-28 IMAGING — CR DG CHEST 2V
2 series · 2 of 2 positions shown · non-contrast
Comparison: None.

CLINICAL DATA: Chest pain

EXAM:
CHEST - 2 VIEW

[w chest pa]
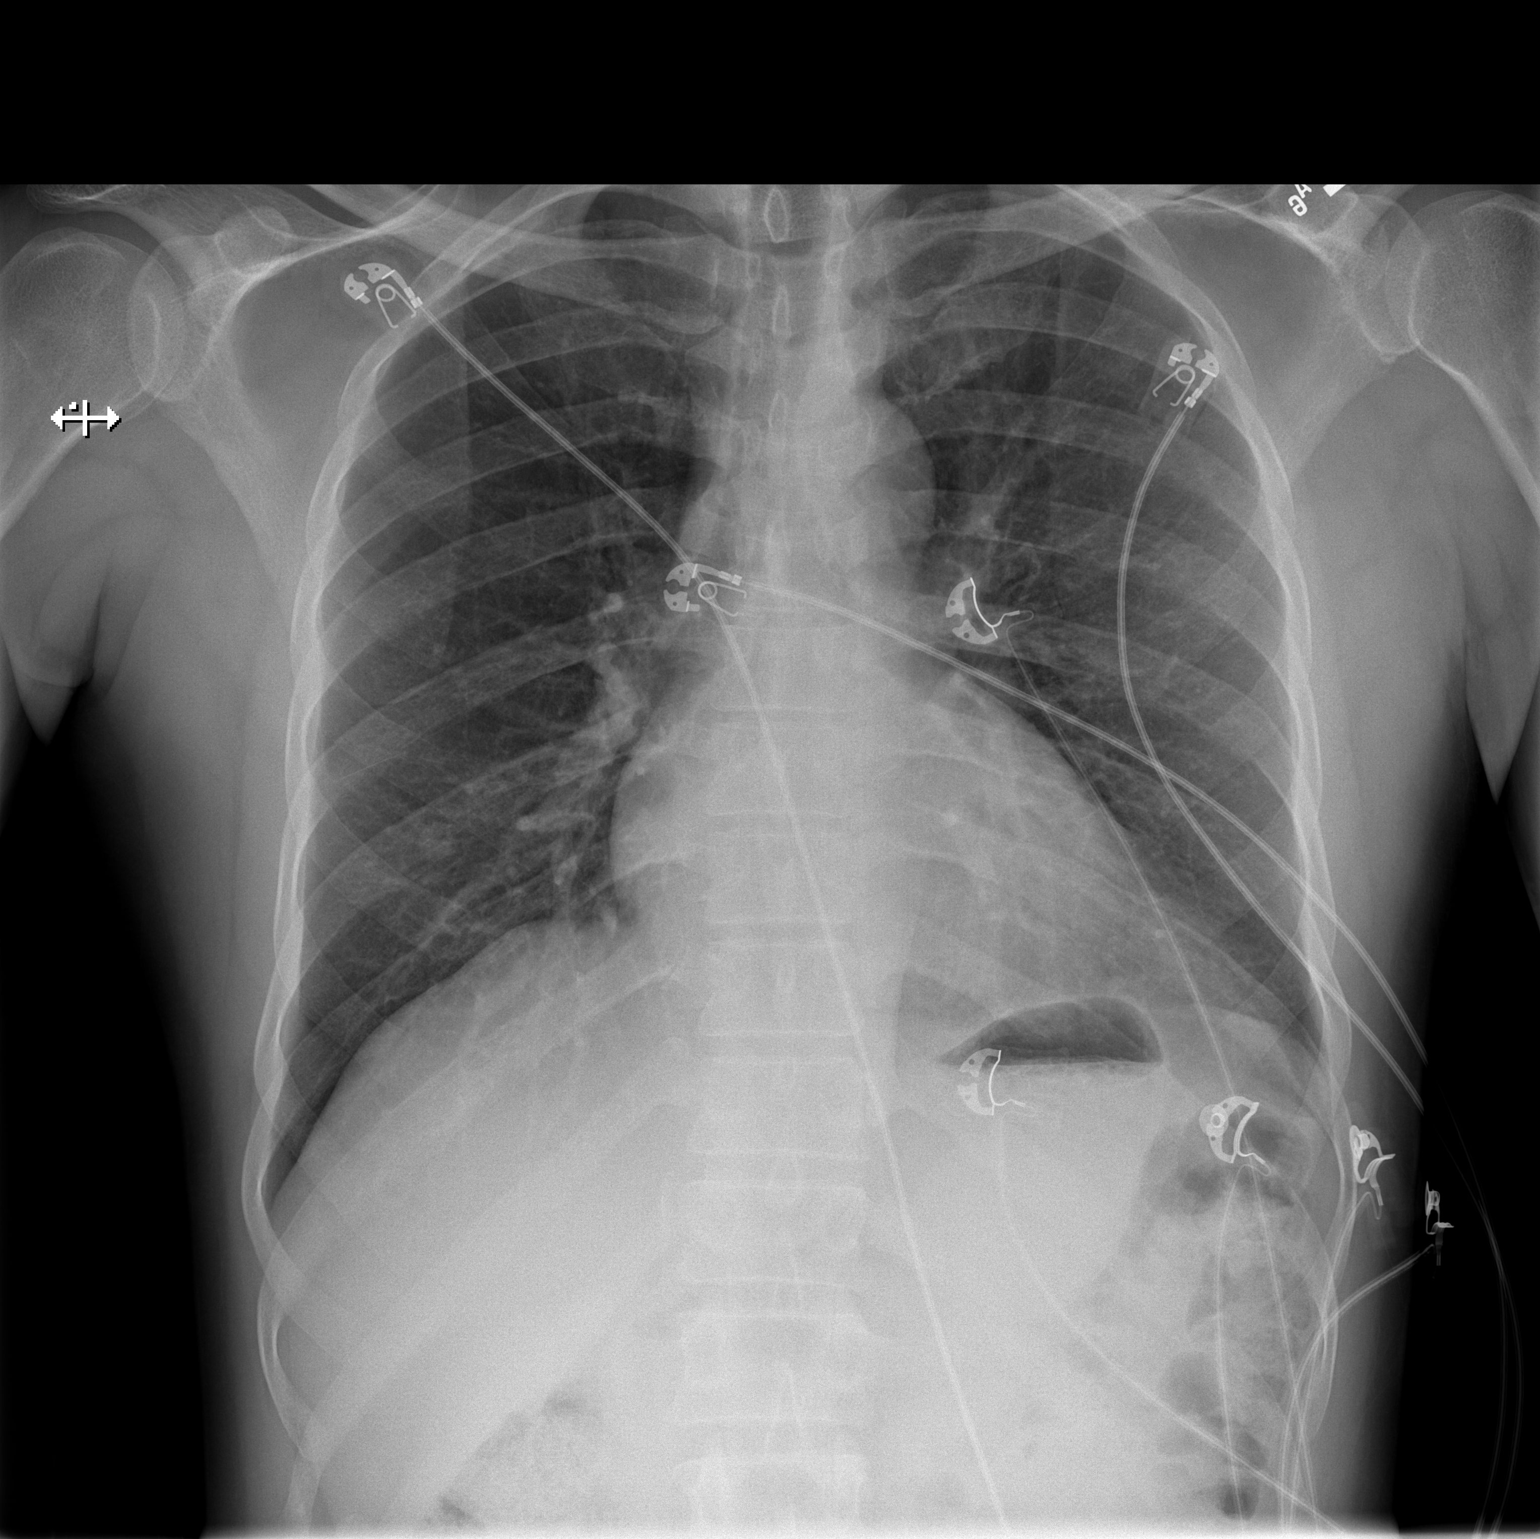

[w chest lat]
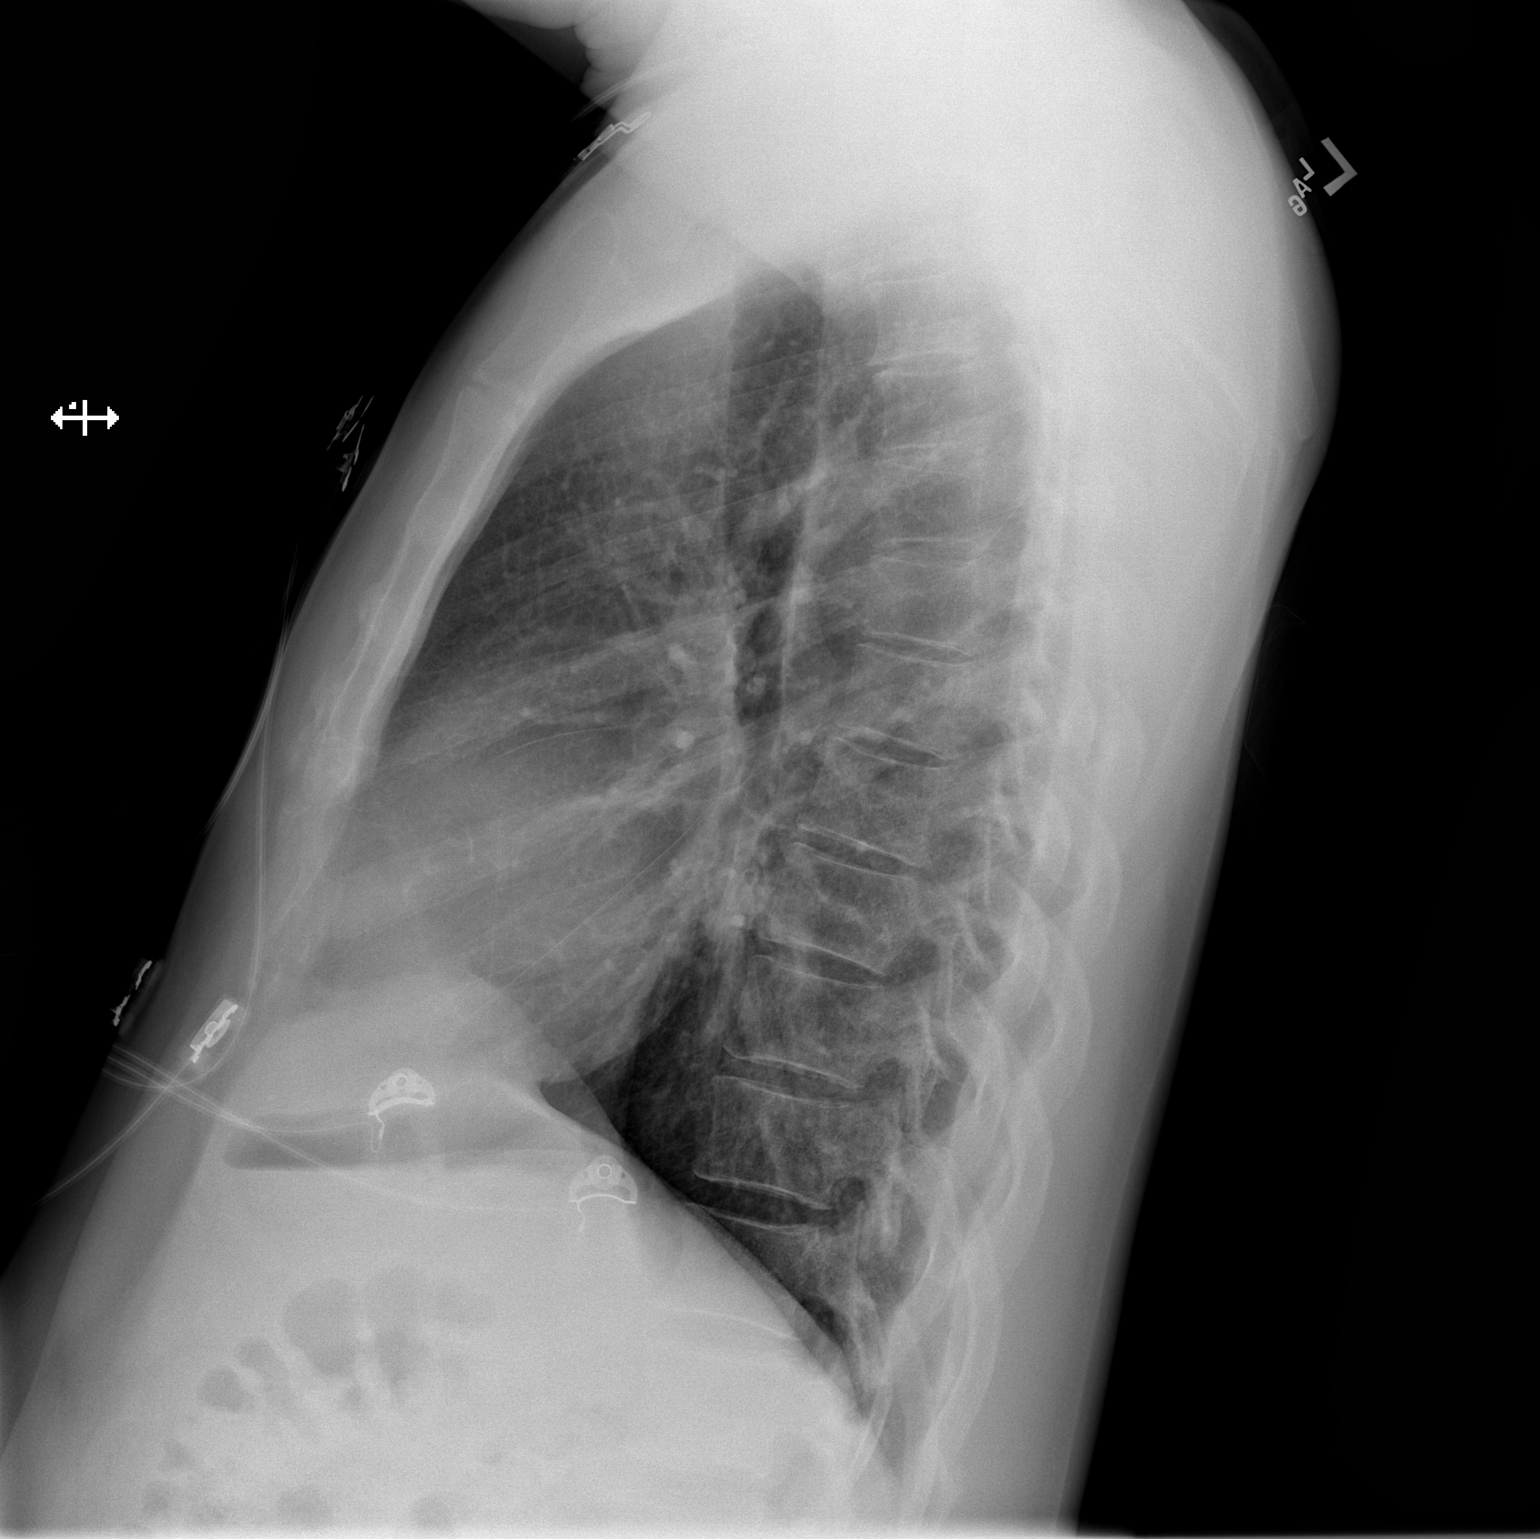

[2 of 2 positions shown; findings below may reference images not displayed]

FINDINGS: The heart size and mediastinal contours are within normal limits.
Both lungs are clear. The visualized skeletal structures are
unremarkable.
IMPRESSION: Negative.

## 2022-06-04 DIAGNOSIS — Z23 Encounter for immunization: Secondary | ICD-10-CM | POA: Diagnosis not present

## 2022-06-04 DIAGNOSIS — Z1212 Encounter for screening for malignant neoplasm of rectum: Secondary | ICD-10-CM | POA: Diagnosis not present

## 2022-06-04 DIAGNOSIS — Z0000 Encounter for general adult medical examination without abnormal findings: Secondary | ICD-10-CM | POA: Diagnosis not present

## 2022-06-04 DIAGNOSIS — Z1211 Encounter for screening for malignant neoplasm of colon: Secondary | ICD-10-CM | POA: Diagnosis not present

## 2022-06-04 DIAGNOSIS — E7849 Other hyperlipidemia: Secondary | ICD-10-CM | POA: Diagnosis not present

## 2022-06-04 DIAGNOSIS — Z111 Encounter for screening for respiratory tuberculosis: Secondary | ICD-10-CM | POA: Diagnosis not present

## 2022-06-18 ENCOUNTER — Ambulatory Visit: Payer: Medicaid Other | Admitting: Family Medicine

## 2022-06-18 ENCOUNTER — Encounter: Payer: Self-pay | Admitting: Family Medicine

## 2022-06-18 VITALS — BP 124/82 | HR 70 | Temp 97.5°F | Ht 70.0 in | Wt 174.2 lb

## 2022-06-18 DIAGNOSIS — Z021 Encounter for pre-employment examination: Secondary | ICD-10-CM

## 2022-06-18 DIAGNOSIS — Z111 Encounter for screening for respiratory tuberculosis: Secondary | ICD-10-CM | POA: Diagnosis not present

## 2022-06-18 DIAGNOSIS — Z1159 Encounter for screening for other viral diseases: Secondary | ICD-10-CM | POA: Diagnosis not present

## 2022-06-18 DIAGNOSIS — Z7689 Persons encountering health services in other specified circumstances: Secondary | ICD-10-CM

## 2022-06-18 DIAGNOSIS — J302 Other seasonal allergic rhinitis: Secondary | ICD-10-CM | POA: Insufficient documentation

## 2022-06-18 DIAGNOSIS — Z0100 Encounter for examination of eyes and vision without abnormal findings: Secondary | ICD-10-CM

## 2022-06-18 NOTE — Patient Instructions (Signed)
We are checking for Hepatitis B and Tuberculosis. We will call with the results.

## 2022-06-18 NOTE — Assessment & Plan Note (Signed)
Taff presents to establish care and for a health examination required for employment. No acute health issues identified. Plan: Welcome the patient to the practice. Encourage maintenance of a healthy lifestyle and regular health screenings as per guidelines.

## 2022-06-18 NOTE — Assessment & Plan Note (Signed)
Hepatitis B vaccination is required for form.  Patient is unsure of his hepatitis B status.  Testing to see if has immunity.

## 2022-06-18 NOTE — Progress Notes (Signed)
Assessment/Plan:   Problem List Items Addressed This Visit       Other   Seasonal allergies   Encounter to establish care - Primary    [place] presents to establish care and for a health examination required for employment. No acute health issues identified. Plan: Welcome the patient to the practice. Encourage maintenance of a healthy lifestyle and regular health screenings as per guidelines.      Need for hepatitis B screening test    Hepatitis B vaccination is required for form.  Patient is unsure of his hepatitis B status.  Testing to see if has immunity.      Relevant Orders   Hepatitis B surface antibody,qualitative   Other Visit Diagnoses     Screening-pulmonary TB       Relevant Orders   QuantiFERON-TB Gold Plus   Encounter for vision screening       Pre-employment health screening examination           Medications Discontinued During This Encounter  Medication Reason   albuterol (VENTOLIN HFA) 108 (90 Base) MCG/ACT inhaler    aspirin 81 MG chewable tablet    FLAXSEED, LINSEED, PO    fluticasone (FLONASE) 50 MCG/ACT nasal spray    ibuprofen (ADVIL) 200 MG tablet       Subjective:  HPI: Encounter date: 06/18/2022  William Mcconnell is a 52 y.o. male who has Seasonal allergies; Encounter to establish care; and Need for hepatitis B screening test on their problem list..   He  has a past medical history of Hyperlipidemia.Marland Kitchen   CHIEF COMPLAINT: William Mcconnell, a 52 year old male, presents for care establishment and tuberculosis (TB) screening as part of a health examination requirement for [employer].  HISTORY OF PRESENT ILLNESS:  General Overview: The patient expresses no significant medical concerns at present. He is here primarily to establish care and for compliance with pre-employment health screening requirements including tuberculosis, general fitness for work and immunization history. Tanveer mentions taking Zyrtec as needed for allergies  without reporting any specific issues related to its use.  Review of Systems  Constitutional: Negative.  Negative for chills, diaphoresis, fever, malaise/fatigue and weight loss.  HENT:  Negative for ear discharge, ear pain and hearing loss.   Eyes: Negative.  Negative for blurred vision, double vision, photophobia, pain, discharge and redness.  Respiratory: Negative.  Negative for cough, sputum production, shortness of breath and wheezing.   Cardiovascular: Negative.  Negative for chest pain and palpitations.  Gastrointestinal: Negative.  Negative for abdominal pain, blood in stool, constipation, diarrhea, heartburn, melena, nausea and vomiting.  Genitourinary:  Negative for dysuria, flank pain, frequency, hematuria and urgency.  Musculoskeletal: Negative.  Negative for myalgias.  Skin: Negative.  Negative for itching and rash.  Neurological:  Negative for dizziness, tingling, tremors, speech change, seizures, loss of consciousness, weakness and headaches.  Psychiatric/Behavioral:  Negative for depression, hallucinations, memory loss, substance abuse and suicidal ideas. The patient does not have insomnia.     Past Surgical History:  Procedure Laterality Date   APPENDECTOMY      Outpatient Medications Prior to Visit  Medication Sig Dispense Refill   cetirizine (ZYRTEC) 10 MG tablet Take 10 mg by mouth daily.     rosuvastatin (CRESTOR) 5 MG tablet Take 5 mg by mouth at bedtime. (Patient not taking: Reported on 06/18/2022)     albuterol (VENTOLIN HFA) 108 (90 Base) MCG/ACT inhaler Inhale 2 puffs into the lungs every 6 (six) hours as needed. (Patient not  taking: Reported on 06/18/2022)     aspirin 81 MG chewable tablet Chew 81 mg by mouth once. (Patient not taking: Reported on 06/18/2022)     FLAXSEED, LINSEED, PO Take by mouth. Boiled Flaxseeds and Drunk mixed w/ coffee (Patient not taking: Reported on 06/18/2022)     fluticasone (FLONASE) 50 MCG/ACT nasal spray Place 1 spray into both nostrils  daily. (Patient not taking: Reported on 06/18/2022) 16 g 2   ibuprofen (ADVIL) 200 MG tablet Take 200 mg by mouth every 6 (six) hours as needed. (Patient not taking: Reported on 06/18/2022)     No facility-administered medications prior to visit.    History reviewed. No pertinent family history.  Social History   Socioeconomic History   Marital status: Married    Spouse name: Not on file   Number of children: Not on file   Years of education: Not on file   Highest education level: Not on file  Occupational History   Not on file  Tobacco Use   Smoking status: Never    Passive exposure: Never   Smokeless tobacco: Never  Vaping Use   Vaping Use: Never used  Substance and Sexual Activity   Alcohol use: Not Currently   Drug use: Never   Sexual activity: Not on file  Other Topics Concern   Not on file  Social History Narrative   Not on file   Social Determinants of Health   Financial Resource Strain: Not on file  Food Insecurity: Not on file  Transportation Needs: Not on file  Physical Activity: Not on file  Stress: Not on file  Social Connections: Not on file  Intimate Partner Violence: Not on file                                                                                                 Objective:  Physical Exam: BP 124/82 (BP Location: Left Arm, Patient Position: Sitting, Cuff Size: Large)   Pulse 70   Temp (!) 97.5 F (36.4 C) (Temporal)   Ht 5\' 10"  (1.778 m)   Wt 174 lb 3.2 oz (79 kg)   SpO2 97%   BMI 25.00 kg/m     Physical Exam  Labs: Recent complete blood count, were reviewed with no pertinent findings relevant to today's office visit.     Alesia Banda, MD, MS

## 2022-06-20 LAB — QUANTIFERON-TB GOLD PLUS
Mitogen-NIL: >10 IU/mL
NIL: 0.14 IU/mL
QuantiFERON-TB Gold Plus: NEGATIVE
TB1-NIL: 0.01 IU/mL
TB2-NIL: 0.01 IU/mL

## 2022-06-20 LAB — HEPATITIS B SURFACE ANTIBODY,QUALITATIVE: Hep B S Ab: NONREACTIVE

## 2022-06-21 ENCOUNTER — Telehealth: Payer: Self-pay | Admitting: Family Medicine

## 2022-06-21 NOTE — Telephone Encounter (Signed)
Pt said he requested a copy of his tb test results. I did not see it here. Please copy and call when ready.

## 2022-06-21 NOTE — Telephone Encounter (Signed)
Left patient a detailed voice message advising him that his labs haven't been reviewed as of yet, but once Dr. Grandville Silos returns on Monday we will update him with results and fill out his physical form. I advised him to return call to office with any other concerns.

## 2022-06-26 ENCOUNTER — Ambulatory Visit (INDEPENDENT_AMBULATORY_CARE_PROVIDER_SITE_OTHER): Payer: Medicaid Other

## 2022-06-26 ENCOUNTER — Other Ambulatory Visit: Payer: Self-pay

## 2022-06-26 DIAGNOSIS — Z23 Encounter for immunization: Secondary | ICD-10-CM

## 2022-06-26 NOTE — Progress Notes (Signed)
Patient scheduled today for nurse visit for 1st  hep b vaccine series.

## 2022-06-26 NOTE — Progress Notes (Signed)
After obtaining consent, and per orders of Dr. Grandville Silos, injection of Hep B (Heplisav) given IM in the left deltoid by Alinda Dooms [place]. Patient instructed to remain in clinic for 20 minutes afterwards, and to report any adverse reaction to me immediately. Pt scheduled for 2nd hep B on 07/30/22.

## 2022-07-23 ENCOUNTER — Ambulatory Visit (INDEPENDENT_AMBULATORY_CARE_PROVIDER_SITE_OTHER): Payer: Medicaid Other

## 2022-07-23 DIAGNOSIS — Z23 Encounter for immunization: Secondary | ICD-10-CM

## 2022-07-23 NOTE — Progress Notes (Signed)
After obtaining consent, and per orders of Dr. Janee Morn, injection of He given by Pamala Hurry [place]. Patient instructed to remain in clinic for 20 minutes afterwards, and to report any adverse reaction to me immediately.

## 2022-07-30 ENCOUNTER — Ambulatory Visit: Payer: Medicaid Other

## 2023-01-10 DIAGNOSIS — Z23 Encounter for immunization: Secondary | ICD-10-CM | POA: Diagnosis not present

## 2023-02-11 DIAGNOSIS — Z23 Encounter for immunization: Secondary | ICD-10-CM | POA: Diagnosis not present

## 2023-02-11 DIAGNOSIS — Z1212 Encounter for screening for malignant neoplasm of rectum: Secondary | ICD-10-CM | POA: Diagnosis not present

## 2023-02-11 DIAGNOSIS — Z0289 Encounter for other administrative examinations: Secondary | ICD-10-CM | POA: Diagnosis not present

## 2023-02-11 DIAGNOSIS — Z125 Encounter for screening for malignant neoplasm of prostate: Secondary | ICD-10-CM | POA: Diagnosis not present

## 2023-02-11 DIAGNOSIS — Z1211 Encounter for screening for malignant neoplasm of colon: Secondary | ICD-10-CM | POA: Diagnosis not present

## 2023-02-13 ENCOUNTER — Ambulatory Visit: Payer: Medicaid Other | Admitting: Internal Medicine

## 2023-02-18 DIAGNOSIS — Z125 Encounter for screening for malignant neoplasm of prostate: Secondary | ICD-10-CM | POA: Diagnosis not present

## 2023-02-18 DIAGNOSIS — Z23 Encounter for immunization: Secondary | ICD-10-CM | POA: Diagnosis not present

## 2023-02-18 DIAGNOSIS — Z0289 Encounter for other administrative examinations: Secondary | ICD-10-CM | POA: Diagnosis not present

## 2023-02-18 LAB — COLOGUARD

## 2023-02-18 LAB — EXTERNAL GENERIC LAB PROCEDURE

## 2023-03-10 DIAGNOSIS — Z1212 Encounter for screening for malignant neoplasm of rectum: Secondary | ICD-10-CM | POA: Diagnosis not present

## 2023-03-10 DIAGNOSIS — Z1211 Encounter for screening for malignant neoplasm of colon: Secondary | ICD-10-CM | POA: Diagnosis not present

## 2023-03-19 LAB — COLOGUARD: COLOGUARD: NEGATIVE

## 2023-03-19 LAB — EXTERNAL GENERIC LAB PROCEDURE: COLOGUARD: NEGATIVE

## 2023-04-28 ENCOUNTER — Telehealth: Payer: Self-pay | Admitting: Family Medicine

## 2023-04-28 NOTE — Telephone Encounter (Signed)
Spoke with patient and advised him of annotation below and he stated he needed hep b antigen testing as well. I advised patient I would schedule him an OV with PCP to discuss and to bring in paper requesting vaccines. Pt scheduled on 2/17  at 1pm

## 2023-04-28 NOTE — Telephone Encounter (Signed)
Copied from CRM [id number]. Topic: Appointments - Appointment Scheduling >> Apr 28, 2023 12:21 PM Kathryne Eriksson wrote: Patient/patient representative is calling to schedule an appointment. Refer to attachments for appointment information. >> Apr 28, 2023 12:28 PM Kathryne Eriksson wrote: Patient called in stating he wanted to schedule to receive some vaccinations.   Polio IPV (3) Yellow Fever Vaccine Hep A (1) Hep B Antibodies & Antigens

## 2023-04-28 NOTE — Telephone Encounter (Signed)
Please advise, we don't have the yellow fever vaccine here in our office, but I found his vaccine record below. Looks like he just needs: Polio, and Hep A? Hep B is completed.

## 2023-05-19 ENCOUNTER — Ambulatory Visit: Payer: Medicaid Other | Admitting: Family Medicine

## 2024-01-22 ENCOUNTER — Encounter (HOSPITAL_BASED_OUTPATIENT_CLINIC_OR_DEPARTMENT_OTHER): Payer: Self-pay

## 2024-01-22 ENCOUNTER — Emergency Department (HOSPITAL_BASED_OUTPATIENT_CLINIC_OR_DEPARTMENT_OTHER)
Admission: EM | Admit: 2024-01-22 | Discharge: 2024-01-22 | Disposition: A | Discharge status: Home / Self Care | Payer: Self-pay | Attending: Emergency Medicine | Admitting: Emergency Medicine

## 2024-01-22 ENCOUNTER — Other Ambulatory Visit: Payer: Self-pay

## 2024-01-22 ENCOUNTER — Emergency Department (HOSPITAL_BASED_OUTPATIENT_CLINIC_OR_DEPARTMENT_OTHER): Payer: Self-pay

## 2024-01-22 DIAGNOSIS — R9431 Abnormal electrocardiogram [ECG] [EKG]: Secondary | ICD-10-CM | POA: Diagnosis not present

## 2024-01-22 DIAGNOSIS — R1013 Epigastric pain: Secondary | ICD-10-CM | POA: Insufficient documentation

## 2024-01-22 LAB — CBC WITH DIFFERENTIAL/PLATELET
Abs Immature Granulocytes: 0.02 K/uL (ref 0.00–0.07)
Basophils Absolute: 0 K/uL (ref 0.0–0.1)
Basophils Relative: 0 %
Eosinophils Absolute: 0.3 K/uL (ref 0.0–0.5)
Eosinophils Relative: 8 %
HCT: 41.5 % (ref 39.0–52.0)
Hemoglobin: 14.1 g/dL (ref 13.0–17.0)
Immature Granulocytes: 1 %
Lymphocytes Relative: 36 %
Lymphs Abs: 1.6 K/uL (ref 0.7–4.0)
MCH: 28.1 pg (ref 26.0–34.0)
MCHC: 34 g/dL (ref 30.0–36.0)
MCV: 82.7 fL (ref 80.0–100.0)
Monocytes Absolute: 0.5 K/uL (ref 0.1–1.0)
Monocytes Relative: 11 %
Neutro Abs: 2 K/uL (ref 1.7–7.7)
Neutrophils Relative %: 44 %
Platelets: 243 K/uL (ref 150–400)
RBC: 5.02 MIL/uL (ref 4.22–5.81)
RDW: 13.5 % (ref 11.5–15.5)
WBC: 4.4 K/uL (ref 4.0–10.5)
nRBC: 0 % (ref 0.0–0.2)

## 2024-01-22 LAB — COMPREHENSIVE METABOLIC PANEL WITH GFR
ALT: 38 U/L (ref 0–44)
AST: 76 U/L — ABNORMAL HIGH (ref 15–41)
Albumin: 4.7 g/dL (ref 3.5–5.0)
Alkaline Phosphatase: 85 U/L (ref 38–126)
Anion gap: 10 (ref 5–15)
BUN: 12 mg/dL (ref 6–20)
CO2: 28 mmol/L (ref 22–32)
Calcium: 10.5 mg/dL — ABNORMAL HIGH (ref 8.9–10.3)
Chloride: 103 mmol/L (ref 98–111)
Creatinine, Ser: 1.24 mg/dL (ref 0.61–1.24)
GFR, Estimated: >60 mL/min (ref >60)
Glucose, Bld: 99 mg/dL (ref 70–99)
Potassium: 3.9 mmol/L (ref 3.5–5.1)
Sodium: 140 mmol/L (ref 135–145)
Total Bilirubin: 0.4 mg/dL (ref 0.0–1.2)
Total Protein: 7.3 g/dL (ref 6.5–8.1)

## 2024-01-22 LAB — TROPONIN T, HIGH SENSITIVITY
Troponin T High Sensitivity: <15 ng/L (ref 0–19)
Troponin T High Sensitivity: <15 ng/L (ref 0–19)

## 2024-01-22 LAB — LIPASE, BLOOD: Lipase: 29 U/L (ref 11–51)

## 2024-01-22 MED ORDER — ONDANSETRON HCL 4 MG/2ML IJ SOLN
4.0000 mg | Freq: Once | INTRAMUSCULAR | Status: AC
  Administered 2024-01-22: 4 mg via INTRAVENOUS
  Filled 2024-01-22: qty 2

## 2024-01-22 MED ORDER — PANTOPRAZOLE SODIUM 40 MG PO TBEC: 40.0000 mg | DELAYED_RELEASE_TABLET | Freq: Every day | ORAL | 0 refills | Status: AC

## 2024-01-22 MED ORDER — ALUM & MAG HYDROXIDE-SIMETH 200-200-20 MG/5ML PO SUSP
30.0000 mL | Freq: Once | ORAL | Status: AC
Start: 2024-01-22 — End: 2024-01-22
  Administered 2024-01-22: 30 mL via ORAL
  Filled 2024-01-22: qty 30

## 2024-01-22 MED ORDER — IOHEXOL 300 MG/ML  SOLN
100.0000 mL | Freq: Once | INTRAMUSCULAR | Status: AC | PRN
  Administered 2024-01-22: 100 mL via INTRAVENOUS

## 2024-01-22 MED ORDER — MORPHINE SULFATE (PF) 4 MG/ML IV SOLN
4.0000 mg | Freq: Once | INTRAVENOUS | Status: AC
  Administered 2024-01-22: 4 mg via INTRAVENOUS
  Filled 2024-01-22: qty 1

## 2024-01-22 MED ORDER — SODIUM CHLORIDE 0.9 % IV BOLUS
1000.0000 mL | Freq: Once | INTRAVENOUS | Status: AC
  Administered 2024-01-22: 1000 mL via INTRAVENOUS

## 2024-01-22 MED ORDER — PANTOPRAZOLE SODIUM 40 MG PO TBEC: 40.0000 mg | DELAYED_RELEASE_TABLET | Freq: Every day | ORAL | 0 refills | Status: DC

## 2024-01-22 NOTE — ED Provider Notes (Signed)
 [facility] AT [facility]  Provider Note  CSN: [id number] Arrival date & time: 01/22/24 0010  History Chief Complaint  Patient presents with   Abdominal Pain    William Mcconnell is a 53 y.o. male with no significant PMH, report appendectomy, presents for sudden onset of epigastric pain, woke him from sleep about prior to arrival and about 2 hours after last meal. Pain is associated with nausea but no vomiting. No recent illness, no prior history of similar. Denies NSAID or EtOH use. No new medications, only taking Zyrtec PRN.    Home Medications Prior to Admission medications   Medication Sig Start Date End Date Taking? Authorizing Provider  pantoprazole  (PROTONIX ) 40 MG tablet Take 1 tablet (40 mg total) by mouth daily. 01/22/24  Yes Roselyn Carlin NOVAK, MD  cetirizine (ZYRTEC) 10 MG tablet Take 10 mg by mouth daily. 07/24/20   [provider]  rosuvastatin (CRESTOR) 5 MG tablet Take 5 mg by mouth at bedtime. Patient not taking: Reported on 06/18/2022 07/25/20   [provider]     Allergies    Patient has no known allergies.   Review of Systems   Review of Systems Please see HPI for pertinent positives and negatives  Physical Exam BP (!) 116/90   Pulse 63   Temp (!) 97.3 F (36.3 C) (Oral)   Resp 13   Ht 5' 10 (1.778 m)   Wt 79 kg   SpO2 100%   BMI 24.99 kg/m   Physical Exam Vitals and nursing note reviewed.  Constitutional:      Appearance: Normal appearance.  HENT:     Head: Normocephalic and atraumatic.     Nose: Nose normal.     Mouth/Throat:     Mouth: Mucous membranes are moist.  Eyes:     Extraocular Movements: Extraocular movements intact.     Conjunctiva/sclera: Conjunctivae normal.  Cardiovascular:     Rate and Rhythm: Normal rate.  Pulmonary:     Effort: Pulmonary effort is normal.     Breath sounds: Normal breath sounds.  Abdominal:     General: Abdomen is flat.     Palpations: Abdomen is  soft.     Tenderness: There is abdominal tenderness in the epigastric area. There is no guarding. Negative signs include Murphy's sign and McBurney's sign.  Musculoskeletal:        General: No swelling. Normal range of motion.     Cervical back: Neck supple.  Skin:    General: Skin is warm and dry.  Neurological:     General: No focal deficit present.     Mental Status: He is alert.  Psychiatric:        Mood and Affect: Mood normal.     ED Results / Procedures / Treatments   EKG EKG Interpretation Date/Time:  Thursday January 22 2024 00:37:10 EDT Ventricular Rate:  62 PR Interval:  180 QRS Duration:  94 QT Interval:  380 QTC Calculation: 386 R Axis:   67  Text Interpretation: Sinus rhythm Consider left atrial enlargement No significant change since last tracing Confirmed by Roselyn Carlin [phone]) on 01/22/2024 12:38:18 AM  Procedures Procedures  Medications Ordered in the ED Medications  morphine (PF) 4 MG/ML injection 4 mg (4 mg Intravenous Given 01/22/24 0044)  ondansetron (ZOFRAN) injection 4 mg (4 mg Intravenous Given 01/22/24 0044)  sodium chloride 0.9 % bolus 1,000 mL (0 mLs Intravenous Stopped 01/22/24 0228)  iohexol (OMNIPAQUE) 300 MG/ML solution 100 mL (  100 mLs Intravenous Contrast Given 01/22/24 0123)  alum & mag hydroxide-simeth (MAALOX/MYLANTA) 200-200-20 MG/5ML suspension 30 mL (30 mLs Oral Given 01/22/24 0145)    Initial Impression and Plan  Patient here with epigastric pain, consider PUD, gastritis, biliary disease, less likely ACS. Will check labs and send for CT as US  not available at this time. Pain and nausea meds for comfort.   ED Course   Clinical Course as of 01/22/24 0231  Thu Jan 22, 2024  0041 CBC is unremarkable.  [CS]  0103 CMP, lipase and initial trop are neg.  [CS]  0145 I personally viewed the images from radiology studies and agree with radiologist interpretation: CT neg for acute process. Will give GI cocktail for suspected  gastritis/GERD.  [CS]  0229 Repeat trop remains normal. Patient reports pain is resolved, feeling back to baseline and ready to go home. Will start PPI for suspected gastritis, recommend PCP and/or GI follow up if symptoms persist. RTED for any other concerns. [CS]    Clinical Course User Index [CS] Roselyn Carlin NOVAK, MD     MDM Rules/Calculators/A&P Medical Decision Making Problems Addressed: Epigastric pain: acute illness or injury  Amount and/or Complexity of Data Reviewed Labs: ordered. Decision-making details documented in ED Course. Radiology: ordered and independent interpretation performed. Decision-making details documented in ED Course. ECG/medicine tests: ordered and independent interpretation performed. Decision-making details documented in ED Course.  Risk OTC drugs. Prescription drug management. Parenteral controlled substances.     Final Clinical Impression(s) / ED Diagnoses Final diagnoses:  Epigastric pain    Rx / DC Orders ED Discharge Orders          Ordered    pantoprazole  (PROTONIX ) 40 MG tablet  Daily        01/22/24 0231             Roselyn Carlin NOVAK, MD 01/22/24 [phone]

## 2024-01-22 NOTE — ED Triage Notes (Signed)
 Pt to ED from home with c/o epigastric pain which started 30 minutes ago. Denies and NVD, states he has had reflux previously, Arrives A+O, VSS, NADN.

## 2024-01-26 ENCOUNTER — Telehealth: Payer: Self-pay

## 2024-01-26 NOTE — Transitions of Care (Post Inpatient/ED Visit) (Signed)
   01/26/2024  Name: William Mcconnell MRN: [id number] DOB: March 06, 1971  Today's TOC FU Call Status: Today's TOC FU Call Status:: Unsuccessful Call (1st Attempt) Unsuccessful Call (1st Attempt) Date: 01/26/24  Attempted to reach the patient regarding the most recent Inpatient/ED visit.  Follow Up Plan: Additional outreach attempts will be made to reach the patient to complete the Transitions of Care (Post Inpatient/ED visit) call.   Signature  Ulanda Prose, RMA

## 2024-03-24 DIAGNOSIS — R300 Dysuria: Secondary | ICD-10-CM | POA: Diagnosis not present
